# Patient Record
Sex: Female | Born: 1960 | Race: Black or African American | Hispanic: No | Marital: Single | Smoking: Never smoker
Health system: Southern US, Community
[De-identification: ages and names within clinical notes are randomized; demographics above are authoritative.]

## PROBLEM LIST (undated history)

## (undated) DIAGNOSIS — I10 Essential (primary) hypertension: Secondary | ICD-10-CM

## (undated) HISTORY — PX: BREAST SURGERY: SHX581

---

## 1991-10-05 HISTORY — PX: REDUCTION MAMMAPLASTY: SUR839

## 2009-06-28 ENCOUNTER — Emergency Department (HOSPITAL_BASED_OUTPATIENT_CLINIC_OR_DEPARTMENT_OTHER): Admission: EM | Admit: 2009-06-28 | Discharge: 2009-06-28 | Payer: Self-pay | Admitting: Emergency Medicine

## 2010-02-04 ENCOUNTER — Ambulatory Visit: Payer: Self-pay | Admitting: Interventional Radiology

## 2010-02-04 ENCOUNTER — Emergency Department (HOSPITAL_BASED_OUTPATIENT_CLINIC_OR_DEPARTMENT_OTHER): Admission: EM | Admit: 2010-02-04 | Discharge: 2010-02-04 | Payer: Self-pay | Admitting: Emergency Medicine

## 2010-03-05 ENCOUNTER — Ambulatory Visit (HOSPITAL_BASED_OUTPATIENT_CLINIC_OR_DEPARTMENT_OTHER): Admission: RE | Admit: 2010-03-05 | Discharge: 2010-03-05 | Payer: Self-pay | Admitting: Internal Medicine

## 2010-03-05 ENCOUNTER — Ambulatory Visit: Payer: Self-pay | Admitting: Diagnostic Radiology

## 2010-12-06 ENCOUNTER — Emergency Department (INDEPENDENT_AMBULATORY_CARE_PROVIDER_SITE_OTHER): Payer: 59

## 2010-12-06 ENCOUNTER — Emergency Department (HOSPITAL_BASED_OUTPATIENT_CLINIC_OR_DEPARTMENT_OTHER)
Admission: EM | Admit: 2010-12-06 | Discharge: 2010-12-06 | Disposition: A | Discharge status: Home / Self Care | Payer: 59 | Attending: Emergency Medicine | Admitting: Emergency Medicine

## 2010-12-06 DIAGNOSIS — I10 Essential (primary) hypertension: Secondary | ICD-10-CM | POA: Insufficient documentation

## 2010-12-06 DIAGNOSIS — R1031 Right lower quadrant pain: Secondary | ICD-10-CM

## 2010-12-06 DIAGNOSIS — N390 Urinary tract infection, site not specified: Secondary | ICD-10-CM | POA: Insufficient documentation

## 2010-12-06 DIAGNOSIS — R109 Unspecified abdominal pain: Secondary | ICD-10-CM | POA: Insufficient documentation

## 2010-12-06 LAB — URINALYSIS, ROUTINE W REFLEX MICROSCOPIC
Glucose, UA: NEGATIVE mg/dL
Ketones, ur: NEGATIVE mg/dL
Specific Gravity, Urine: 1.027 (ref 1.005–1.030)
pH: 7 (ref 5.0–8.0)

## 2010-12-06 LAB — URINE MICROSCOPIC-ADD ON

## 2011-02-26 ENCOUNTER — Emergency Department (HOSPITAL_BASED_OUTPATIENT_CLINIC_OR_DEPARTMENT_OTHER)
Admission: EM | Admit: 2011-02-26 | Discharge: 2011-02-26 | Disposition: A | Discharge status: Home / Self Care | Payer: 59 | Attending: Emergency Medicine | Admitting: Emergency Medicine

## 2011-02-26 ENCOUNTER — Emergency Department (INDEPENDENT_AMBULATORY_CARE_PROVIDER_SITE_OTHER): Payer: 59

## 2011-02-26 DIAGNOSIS — I10 Essential (primary) hypertension: Secondary | ICD-10-CM | POA: Insufficient documentation

## 2011-02-26 DIAGNOSIS — R1013 Epigastric pain: Secondary | ICD-10-CM | POA: Insufficient documentation

## 2011-02-26 LAB — COMPREHENSIVE METABOLIC PANEL
Albumin: 3.9 g/dL (ref 3.5–5.2)
BUN: 11 mg/dL (ref 6–23)
Chloride: 104 mEq/L (ref 96–112)
Creatinine, Ser: 0.6 mg/dL (ref 0.4–1.2)
GFR calc non Af Amer: >60 mL/min (ref >60)
Glucose, Bld: 83 mg/dL (ref 70–99)
Potassium: 4.3 mEq/L (ref 3.5–5.1)

## 2011-02-26 LAB — URINALYSIS, ROUTINE W REFLEX MICROSCOPIC
Glucose, UA: NEGATIVE mg/dL
Ketones, ur: NEGATIVE mg/dL
Nitrite: NEGATIVE
Specific Gravity, Urine: 1.024 (ref 1.005–1.030)
Urobilinogen, UA: 1 mg/dL (ref 0.0–1.0)
pH: 7 (ref 5.0–8.0)

## 2011-02-26 LAB — CBC
HCT: 37.8 % (ref 36.0–46.0)
MCH: 30.3 pg (ref 26.0–34.0)
MCHC: 34.7 g/dL (ref 30.0–36.0)
Platelets: 231 10*3/uL (ref 150–400)
WBC: 4 10*3/uL (ref 4.0–10.5)

## 2011-02-26 LAB — URINE MICROSCOPIC-ADD ON

## 2011-02-26 LAB — DIFFERENTIAL
Lymphocytes Relative: 45 % (ref 12–46)
Lymphs Abs: 1.8 10*3/uL (ref 0.7–4.0)
Monocytes Absolute: 0.4 10*3/uL (ref 0.1–1.0)
Neutro Abs: 1.7 10*3/uL (ref 1.7–7.7)

## 2011-04-23 ENCOUNTER — Other Ambulatory Visit (HOSPITAL_BASED_OUTPATIENT_CLINIC_OR_DEPARTMENT_OTHER): Payer: Self-pay | Admitting: Obstetrics and Gynecology

## 2011-04-23 DIAGNOSIS — Z1231 Encounter for screening mammogram for malignant neoplasm of breast: Secondary | ICD-10-CM

## 2011-04-29 ENCOUNTER — Ambulatory Visit (HOSPITAL_BASED_OUTPATIENT_CLINIC_OR_DEPARTMENT_OTHER)
Admission: RE | Admit: 2011-04-29 | Discharge: 2011-04-29 | Disposition: A | Discharge status: Home / Self Care | Payer: 59 | Source: Ambulatory Visit | Attending: Obstetrics and Gynecology | Admitting: Obstetrics and Gynecology

## 2011-04-29 DIAGNOSIS — Z1231 Encounter for screening mammogram for malignant neoplasm of breast: Secondary | ICD-10-CM

## 2011-09-09 ENCOUNTER — Emergency Department (HOSPITAL_BASED_OUTPATIENT_CLINIC_OR_DEPARTMENT_OTHER)
Admission: EM | Admit: 2011-09-09 | Discharge: 2011-09-09 | Disposition: A | Discharge status: Home / Self Care | Payer: Self-pay | Attending: Emergency Medicine | Admitting: Emergency Medicine

## 2011-09-09 ENCOUNTER — Encounter: Payer: Self-pay | Admitting: *Deleted

## 2011-09-09 DIAGNOSIS — R05 Cough: Secondary | ICD-10-CM | POA: Insufficient documentation

## 2011-09-09 DIAGNOSIS — B349 Viral infection, unspecified: Secondary | ICD-10-CM

## 2011-09-09 DIAGNOSIS — B9789 Other viral agents as the cause of diseases classified elsewhere: Secondary | ICD-10-CM | POA: Insufficient documentation

## 2011-09-09 DIAGNOSIS — R059 Cough, unspecified: Secondary | ICD-10-CM | POA: Insufficient documentation

## 2011-09-09 DIAGNOSIS — R509 Fever, unspecified: Secondary | ICD-10-CM | POA: Insufficient documentation

## 2011-09-09 MED ORDER — HYDROCODONE-ACETAMINOPHEN 5-500 MG PO TABS: 1.0000 | ORAL_TABLET | Freq: Four times a day (QID) | ORAL | Status: AC | PRN

## 2011-09-09 MED ORDER — KETOROLAC TROMETHAMINE 60 MG/2ML IM SOLN
60.0000 mg | Freq: Once | INTRAMUSCULAR | Status: AC
  Administered 2011-09-09: 60 mg via INTRAMUSCULAR
  Filled 2011-09-09: qty 2

## 2011-09-09 NOTE — ED Provider Notes (Signed)
Medical screening examination/treatment/procedure(s) were performed by non-physician practitioner and as supervising physician I was immediately available for consultation/collaboration.  Nelle Sayed, MD 09/09/11 2246 

## 2011-09-09 NOTE — ED Provider Notes (Signed)
History     CSN: [id number] Arrival date & time: 09/09/2011  2:56 PM   First MD Initiated Contact with Patient 09/09/11 1500      Chief Complaint  Patient presents with  . Fever    (Consider location/radiation/quality/duration/timing/severity/associated sxs/prior treatment) Patient is a 50 y.o. female presenting with fever. The history is provided by the patient. No language interpreter was used.  Fever Primary symptoms of the febrile illness include fever, cough and myalgias. Primary symptoms do not include nausea, vomiting or diarrhea. The current episode started today. This is a new problem. The problem has not changed since onset.   History reviewed. No pertinent past medical history.  Past Surgical History  Procedure Date  . Breast surgery   . Cesarean section     No family history on file.  History  Substance Use Topics  . Smoking status: Not on file  . Smokeless tobacco: Not on file  . Alcohol Use:     OB History    Grav Para Term Preterm Abortions TAB SAB Ect Mult Living                  Review of Systems  Constitutional: Positive for fever.  Respiratory: Positive for cough.   Gastrointestinal: Negative for nausea, vomiting and diarrhea.  Musculoskeletal: Positive for myalgias.  All other systems reviewed and are negative.    Allergies  Review of patient's allergies indicates no known allergies.  Home Medications   Current Outpatient Rx  Name Route Sig Dispense Refill  . IBUPROFEN 200 MG PO TABS Oral Take 400 mg by mouth every 6 (six) hours as needed. For pain and fever     . PHENYLEPH-CPM-DM-ASPIRIN 7.05-05-09-325 MG PO TBEF Oral Take 2 tablets by mouth once as needed. For cold symptoms       BP 141/80  Pulse 113  Temp(Src) 100.9 F (38.3 C) (Oral)  Resp 22  SpO2 98%  Physical Exam  Nursing note and vitals reviewed. Constitutional: She is oriented to person, place, and time. She appears well-developed and well-nourished.  HENT:  Head:  Normocephalic.  Right Ear: External ear normal.  Left Ear: External ear normal.  Nose: Rhinorrhea present.  Mouth/Throat: Posterior oropharyngeal erythema present.  Cardiovascular: Normal rate and regular rhythm.   Pulmonary/Chest: Effort normal and breath sounds normal.  Abdominal: Soft. Bowel sounds are normal.  Musculoskeletal: Normal range of motion.  Neurological: She is alert and oriented to person, place, and time.  Skin: Skin is warm and dry.  Psychiatric: She has a normal mood and affect.    ED Course  Procedures (including critical care time)  Labs Reviewed - No data to display No results found.   1. Viral infection       MDM  Likely ili:no imagining needed at this time:pt feeling better after the toradol:will treat symptomatically at home        Teressa Lower, NP 09/09/11 1634

## 2011-09-09 NOTE — ED Notes (Signed)
Fever, cough, aching all over, headache, and sore throat x 2 days.

## 2012-12-11 ENCOUNTER — Emergency Department (HOSPITAL_BASED_OUTPATIENT_CLINIC_OR_DEPARTMENT_OTHER)
Admission: EM | Admit: 2012-12-11 | Discharge: 2012-12-11 | Disposition: A | Discharge status: Home / Self Care | Payer: 59 | Attending: Emergency Medicine | Admitting: Emergency Medicine

## 2012-12-11 ENCOUNTER — Encounter (HOSPITAL_BASED_OUTPATIENT_CLINIC_OR_DEPARTMENT_OTHER): Payer: Self-pay

## 2012-12-11 DIAGNOSIS — S40019A Contusion of unspecified shoulder, initial encounter: Secondary | ICD-10-CM | POA: Insufficient documentation

## 2012-12-11 DIAGNOSIS — Y92410 Unspecified street and highway as the place of occurrence of the external cause: Secondary | ICD-10-CM | POA: Insufficient documentation

## 2012-12-11 DIAGNOSIS — Y9389 Activity, other specified: Secondary | ICD-10-CM | POA: Insufficient documentation

## 2012-12-11 DIAGNOSIS — S5010XA Contusion of unspecified forearm, initial encounter: Secondary | ICD-10-CM | POA: Insufficient documentation

## 2012-12-11 DIAGNOSIS — S7010XA Contusion of unspecified thigh, initial encounter: Secondary | ICD-10-CM | POA: Insufficient documentation

## 2012-12-11 MED ORDER — NAPROXEN 375 MG PO TABS: 375.0000 mg | ORAL_TABLET | Freq: Two times a day (BID) | ORAL | Status: DC

## 2012-12-11 MED ORDER — CYCLOBENZAPRINE HCL 10 MG PO TABS: 10.0000 mg | ORAL_TABLET | Freq: Two times a day (BID) | ORAL | Status: DC | PRN

## 2012-12-11 MED ORDER — TRAMADOL HCL 50 MG PO TABS: 50.0000 mg | ORAL_TABLET | Freq: Four times a day (QID) | ORAL | Status: DC | PRN
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
## 2012-12-13 NOTE — ED Provider Notes (Signed)
Medical screening examination/treatment/procedure(s) were performed by non-physician practitioner and as supervising physician I was immediately available for consultation/collaboration.  Hurman Horn, MD 12/13/12 2217

## 2013-08-19 ENCOUNTER — Encounter (HOSPITAL_BASED_OUTPATIENT_CLINIC_OR_DEPARTMENT_OTHER): Payer: Self-pay | Admitting: Emergency Medicine

## 2013-08-19 ENCOUNTER — Emergency Department (HOSPITAL_BASED_OUTPATIENT_CLINIC_OR_DEPARTMENT_OTHER)
Admission: EM | Admit: 2013-08-19 | Discharge: 2013-08-19 | Disposition: A | Discharge status: Home / Self Care | Payer: No Typology Code available for payment source | Attending: Emergency Medicine | Admitting: Emergency Medicine

## 2013-08-19 DIAGNOSIS — R110 Nausea: Secondary | ICD-10-CM | POA: Insufficient documentation

## 2013-08-19 DIAGNOSIS — N12 Tubulo-interstitial nephritis, not specified as acute or chronic: Secondary | ICD-10-CM | POA: Diagnosis present

## 2013-08-19 LAB — URINALYSIS, ROUTINE W REFLEX MICROSCOPIC
Bilirubin Urine: NEGATIVE
Glucose, UA: NEGATIVE mg/dL
Ketones, ur: NEGATIVE mg/dL
Nitrite: POSITIVE — AB
Protein, ur: NEGATIVE mg/dL
Specific Gravity, Urine: 1.011 (ref 1.005–1.030)
pH: 6 (ref 5.0–8.0)

## 2013-08-19 LAB — URINE MICROSCOPIC-ADD ON

## 2013-08-19 MED ORDER — CEPHALEXIN 500 MG PO CAPS: 500.0000 mg | ORAL_CAPSULE | Freq: Four times a day (QID) | ORAL | Status: DC

## 2013-08-19 MED ORDER — DEXTROSE 5 % IV SOLN
1.0000 g | Freq: Once | INTRAVENOUS | Status: AC
  Administered 2013-08-19: 1 g via INTRAVENOUS

## 2013-08-19 MED ORDER — CEFTRIAXONE SODIUM 1 G IJ SOLR
INTRAMUSCULAR | Status: AC
  Filled 2013-08-19: qty 10

## 2013-08-19 MED ORDER — ONDANSETRON HCL 4 MG/2ML IJ SOLN
4.0000 mg | Freq: Once | INTRAMUSCULAR | Status: AC
  Administered 2013-08-19: 4 mg via INTRAVENOUS
  Filled 2013-08-19: qty 2

## 2013-08-19 MED ORDER — SODIUM CHLORIDE 0.9 % IV BOLUS (SEPSIS)
1000.0000 mL | INTRAVENOUS | Status: AC
  Administered 2013-08-19: 1000 mL via INTRAVENOUS

## 2013-08-19 MED ORDER — OXYCODONE-ACETAMINOPHEN 5-325 MG PO TABS: 1.0000 | ORAL_TABLET | Freq: Four times a day (QID) | ORAL | Status: DC | PRN

## 2013-08-19 NOTE — ED Provider Notes (Signed)
CSN: [id number]     Arrival date & time 08/19/13  1210 History   First MD Initiated Contact with Patient 08/19/13 1240     Chief Complaint  Patient presents with  . Dysuria   (Consider location/radiation/quality/duration/timing/severity/associated sxs/prior Treatment) Patient is a 52 y.o. female presenting with dysuria. The history is provided by the patient.  Dysuria Pain quality:  Burning Pain severity:  Moderate Onset quality:  Gradual Duration:  5 days Timing:  Constant Progression:  Worsening Chronicity:  New Recent urinary tract infections: no   Relieved by:  Nothing Worsened by:  Nothing tried Ineffective treatments: azo. Associated symptoms: nausea   Associated symptoms: no abdominal pain, no fever and no vomiting     History reviewed. No pertinent past medical history. Past Surgical History  Procedure Laterality Date  . Breast surgery    . Cesarean section     No family history on file. History  Substance Use Topics  . Smoking status: Never Smoker   . Smokeless tobacco: Not on file  . Alcohol Use: No   OB History   Grav Para Term Preterm Abortions TAB SAB Ect Mult Living                 Review of Systems  Constitutional: Negative for fever and fatigue.  HENT: Negative for congestion and drooling.   Eyes: Negative for pain.  Respiratory: Negative for cough and shortness of breath.   Cardiovascular: Negative for chest pain.  Gastrointestinal: Positive for nausea. Negative for vomiting, abdominal pain and diarrhea.  Genitourinary: Positive for dysuria. Negative for hematuria.  Musculoskeletal: Negative for back pain, gait problem and neck pain.  Skin: Negative for color change.  Neurological: Negative for dizziness and headaches.  Hematological: Negative for adenopathy.  Psychiatric/Behavioral: Negative for behavioral problems.  All other systems reviewed and are negative.    Allergies  Review of patient's allergies indicates no known  allergies.  Home Medications  No current outpatient prescriptions on file. BP 108/45  Pulse 113  Temp(Src) 99.9 F (37.7 C) (Oral)  Resp 16  SpO2 100% Physical Exam  Nursing note and vitals reviewed. Constitutional: She is oriented to person, place, and time. She appears well-developed and well-nourished.  HENT:  Head: Normocephalic.  Mouth/Throat: Oropharynx is clear and moist. No oropharyngeal exudate.  Eyes: Conjunctivae and EOM are normal. Pupils are equal, round, and reactive to light.  Neck: Normal range of motion. Neck supple.  Cardiovascular: Regular rhythm, normal heart sounds and intact distal pulses.  Exam reveals no gallop and no friction rub.   No murmur heard. HR 113  Pulmonary/Chest: Effort normal and breath sounds normal. No respiratory distress. She has no wheezes.  Abdominal: Soft. Bowel sounds are normal. There is no tenderness. There is no rebound and no guarding.  Musculoskeletal: Normal range of motion. She exhibits no edema and no tenderness.  Mild bilateral lower lumbar paraspinal tenderness to palpation.  Neurological: She is alert and oriented to person, place, and time.  Skin: Skin is warm and dry.  Psychiatric: She has a normal mood and affect. Her behavior is normal.    ED Course  Procedures (including critical care time) Labs Review Labs Reviewed  URINALYSIS, ROUTINE W REFLEX MICROSCOPIC - Abnormal; Notable for the following:    APPearance CLOUDY (*)    Hgb urine dipstick SMALL (*)    Nitrite POSITIVE (*)    Leukocytes, UA MODERATE (*)    All other components within normal limits  URINE MICROSCOPIC-ADD ON - Abnormal;  Notable for the following:    Squamous Epithelial / LPF FEW (*)    Bacteria, UA FEW (*)    All other components within normal limits  URINE CULTURE   Imaging Review No results found.  EKG Interpretation   None       MDM   1. Pyelonephritis    1:10 PM 52 y.o. female who presents with dysuria for 4-5 days. She also  notes the development of mild low back pain in the last few days. She has nausea but denies any vomiting or fevers. She is mildly tachycardic here. Her urine shows evidence of a urinary tract infection. Will treat her with IV fluids and IV Rocephin.  3:02 PM: Pt feeling much better, no vomiting here. Will send home on po keflex.  I have discussed the diagnosis/risks/treatment options with the patient and believe the pt to be eligible for discharge home to follow-up with pcp as needed. We also discussed returning to the ED immediately if new or worsening sx occur. We discussed the sx which are most concerning (e.g., inability to tolerate abx by mouth, worsening pain) that necessitate immediate return. Any new prescriptions provided to the patient are listed below.  Discharge Medication List as of 08/19/2013  3:03 PM    START taking these medications   Details  cephALEXin (KEFLEX) 500 MG capsule Take 1 capsule (500 mg total) by mouth 4 (four) times daily., Starting 08/19/2013, Until Discontinued, Print    oxyCODONE-acetaminophen (PERCOCET) 5-325 MG per tablet Take 1 tablet by mouth every 6 (six) hours as needed for moderate pain., Starting 08/19/2013, Until Discontinued, Print         Junius Argyle, MD 08/19/13 [phone]

## 2013-08-19 NOTE — ED Notes (Signed)
Patient here with dysuria, hematuria since Tuesday. Using AZO with no relief. Also complains of lower backpain

## 2013-08-21 LAB — URINE CULTURE

## 2014-05-13 ENCOUNTER — Emergency Department (HOSPITAL_BASED_OUTPATIENT_CLINIC_OR_DEPARTMENT_OTHER)
Admission: EM | Admit: 2014-05-13 | Discharge: 2014-05-13 | Discharge status: Against Medical Advice | Payer: No Typology Code available for payment source | Attending: Emergency Medicine | Admitting: Emergency Medicine

## 2014-05-13 ENCOUNTER — Encounter (HOSPITAL_BASED_OUTPATIENT_CLINIC_OR_DEPARTMENT_OTHER): Payer: Self-pay | Admitting: Emergency Medicine

## 2014-05-13 DIAGNOSIS — R51 Headache: Secondary | ICD-10-CM | POA: Insufficient documentation

## 2014-05-13 HISTORY — DX: Essential (primary) hypertension: I10

## 2014-05-13 NOTE — ED Notes (Signed)
Patient called x2 for room assignemnt, no answer.  Walked to bistro/computer area no answer.

## 2014-05-13 NOTE — ED Notes (Addendum)
Patient called x1 for room assignment, no answer.

## 2014-05-13 NOTE — ED Notes (Signed)
Pt c/o h/a with sinus pressure and facial pain x 2 days

## 2015-10-16 ENCOUNTER — Encounter (HOSPITAL_BASED_OUTPATIENT_CLINIC_OR_DEPARTMENT_OTHER): Payer: Self-pay | Admitting: *Deleted

## 2015-10-16 ENCOUNTER — Emergency Department (HOSPITAL_BASED_OUTPATIENT_CLINIC_OR_DEPARTMENT_OTHER)
Admission: EM | Admit: 2015-10-16 | Discharge: 2015-10-16 | Disposition: A | Discharge status: Home / Self Care | Payer: Self-pay | Attending: Emergency Medicine | Admitting: Emergency Medicine

## 2015-10-16 DIAGNOSIS — N390 Urinary tract infection, site not specified: Secondary | ICD-10-CM

## 2015-10-16 DIAGNOSIS — R319 Hematuria, unspecified: Secondary | ICD-10-CM

## 2015-10-16 DIAGNOSIS — Z792 Long term (current) use of antibiotics: Secondary | ICD-10-CM | POA: Insufficient documentation

## 2015-10-16 DIAGNOSIS — R809 Proteinuria, unspecified: Secondary | ICD-10-CM | POA: Insufficient documentation

## 2015-10-16 DIAGNOSIS — I10 Essential (primary) hypertension: Secondary | ICD-10-CM | POA: Insufficient documentation

## 2015-10-16 DIAGNOSIS — N3001 Acute cystitis with hematuria: Secondary | ICD-10-CM | POA: Insufficient documentation

## 2015-10-16 LAB — URINALYSIS, ROUTINE W REFLEX MICROSCOPIC
BILIRUBIN URINE: NEGATIVE
GLUCOSE, UA: NEGATIVE mg/dL
Ketones, ur: NEGATIVE mg/dL
Nitrite: NEGATIVE
PH: 7.5 (ref 5.0–8.0)
Protein, ur: 100 mg/dL — AB
SPECIFIC GRAVITY, URINE: 1.019 (ref 1.005–1.030)

## 2015-10-16 LAB — URINE MICROSCOPIC-ADD ON

## 2015-10-16 MED ORDER — AMLODIPINE BESYLATE 5 MG PO TABS: 5.0000 mg | ORAL_TABLET | Freq: Every day | ORAL | Status: AC

## 2015-10-16 MED ORDER — CEPHALEXIN 500 MG PO CAPS: 500.0000 mg | ORAL_CAPSULE | Freq: Four times a day (QID) | ORAL | Status: DC

## 2015-10-16 MED FILL — AMLODIPINE BESYLATE 5 MG TA: 5 | 30 days supply | Qty: 30 | Fill #0

## 2015-10-16 MED FILL — CEPHALEXIN 500 MG CAPSULE: 500 | 7 days supply | Qty: 28 | Fill #0

## 2015-10-16 NOTE — ED Notes (Signed)
Dysuria x 4 days. Hx of same. Lower back and lower abdominal pain.

## 2015-10-16 NOTE — ED Provider Notes (Signed)
CSN: [id number]     Arrival date & time 10/16/15  1207 History   First MD Initiated Contact with Patient 10/16/15 1216     Chief Complaint  Patient presents with  . Dysuria    HPI  Kayla Pittman is an 55 y.o. female with history of HTN who presents to the ED for evaluation of dysuria, urinary frequency/urgency, and lower abdominal and back pain. She states her symptoms started four days ago. She states her urine is also dark and cloudy. She states she thinks she has a UTI and has had UTIs many times in the past. She denies fever or chills. Denies N/V/D. She states her abdominal and back pain is a dull ache. She has not tried anything for her symptoms. She does not remember what antibiotics have worked well for her in the past.   Past Medical History  Diagnosis Date  . Hypertension    Past Surgical History  Procedure Laterality Date  . Breast surgery    . Cesarean section     No family history on file. Social History  Substance Use Topics  . Smoking status: Never Smoker   . Smokeless tobacco: None  . Alcohol Use: No   OB History    No data available     Review of Systems  All other systems reviewed and are negative.     Allergies  Review of patient's allergies indicates no known allergies.  Home Medications   Prior to Admission medications   Medication Sig Start Date End Date Taking? Authorizing Provider  CLONIDINE HCL PO Take by mouth.   Yes Historical Provider, MD  cephALEXin (KEFLEX) 500 MG capsule Take 1 capsule (500 mg total) by mouth 4 (four) times daily. 08/19/13   Purvis SheffieldForrest Harrison, MD  oxyCODONE-acetaminophen (PERCOCET) 5-325 MG per tablet Take 1 tablet by mouth every 6 (six) hours as needed for moderate pain. 08/19/13   Purvis SheffieldForrest Harrison, MD   BP 152/101 mmHg  Pulse 80  Temp(Src) 98.6 F (37 C) (Oral)  Resp 18  Ht 5\' 2"  (1.575 m)  Wt 104.327 kg  BMI 42.06 kg/m2  SpO2 99% Physical Exam  Constitutional: She is oriented to person, place, and time. No  distress.  HENT:  Right Ear: External ear normal.  Left Ear: External ear normal.  Nose: Nose normal.  Mouth/Throat: Oropharynx is clear and moist. No oropharyngeal exudate.  Eyes: Conjunctivae and EOM are normal. Pupils are equal, round, and reactive to light.  Neck: Normal range of motion. Neck supple.  Cardiovascular: Normal rate, regular rhythm, normal heart sounds and intact distal pulses.   Pulmonary/Chest: Effort normal and breath sounds normal. No respiratory distress. She exhibits no tenderness.  Abdominal: Soft. Bowel sounds are normal. She exhibits no distension. There is tenderness in the suprapubic area. There is no rebound and no guarding.  No CVA tenderness  Musculoskeletal: She exhibits no edema.  Neurological: She is alert and oriented to person, place, and time. No cranial nerve deficit.  Skin: Skin is warm and dry. She is not diaphoretic.  Psychiatric: She has a normal mood and affect.  Nursing note and vitals reviewed.   ED Course  Procedures (including critical care time) Labs Review Labs Reviewed  URINALYSIS, ROUTINE W REFLEX MICROSCOPIC (NOT AT [facility]) - Abnormal; Notable for the following:    Color, Urine AMBER (*)    APPearance TURBID (*)    Hgb urine dipstick LARGE (*)    Protein, ur 100 (*)    Leukocytes, UA LARGE (*)  All other components within normal limits  URINE MICROSCOPIC-ADD ON - Abnormal; Notable for the following:    Squamous Epithelial / LPF 0-5 (*)    Bacteria, UA MANY (*)    All other components within normal limits  URINE CULTURE    Imaging Review No results found. I have personally reviewed and evaluated these images and lab results as part of my medical decision-making.   EKG Interpretation None      MDM   Final diagnoses:  Urinary tract infection with hematuria, site unspecified  Proteinuria  Essential hypertension    UA shows e/o UTI. Pt has been treated with Keflex in the past and tolerated Keflex well. Past cultures  show susceptibility. Will give rx for keflex and send urine for culture. Pt also with new proteinuria. She does have chronic history of HTN and is hypertensive today. Pt has been prescribed clonidine in the past but is no longer taking it. Given ongoing HTN and proteinuria I discussed w/ pt that I will give her rx for Norvasc and to f/u with PCP (resource guide given) for ongoing BP management. She will need re-check of kidney function too. She has no headache, visual disturbances, or other symptoms concerning for hypertensive emergency. Pt verbalized understanding. Otherwise pt is afebrile, nontoxic, and VSS. No indication for further workup at this time. ER return precautions given.     Carlene Coria, PA-C 10/16/15 1352  Kayla Porter, MD 10/23/15 [phone]

## 2015-10-16 NOTE — Discharge Instructions (Signed)
You were seen in the emergency room today for evaluation of UTI symptoms. Your urine test does show evidence of infection. I will give you a prescription for Keflex, an antibiotic, to take for one week. Please take the entire course as prescribed. Please drink plenty of fluids to stay hydrated. Your urine test today also showed protein in your urine which can be an early sign of kidney damage due to high blood pressure. Your blood pressure was high today and it appears it has also been high in the past. Please call one of the clinics in the resource guide provided to establish primary care. You will need to take medication for your blood pressure. Your primary care provider will also need to recheck your kidney function test. In the meantime I will give you a prescription for Norvasc, a blood pressure medicine.   Return to the ER for new or worsening symptoms.

## 2015-10-18 LAB — URINE CULTURE

## 2015-10-20 ENCOUNTER — Telehealth: Payer: Self-pay | Admitting: *Deleted

## 2015-10-20 NOTE — ED Notes (Signed)
(+)  urine culture treated with Cephalexin, no further treatment needed per Corey Ball, Pharm 

## 2015-11-26 ENCOUNTER — Telehealth: Payer: Self-pay | Admitting: Behavioral Health

## 2015-11-26 NOTE — Telephone Encounter (Signed)
Unable to reach patient at time of Pre-Visit Call.  Left message for patient to return call when available.    

## 2015-11-27 ENCOUNTER — Ambulatory Visit: Payer: Self-pay | Admitting: Family Medicine

## 2018-01-22 ENCOUNTER — Emergency Department (HOSPITAL_BASED_OUTPATIENT_CLINIC_OR_DEPARTMENT_OTHER)
Admission: EM | Admit: 2018-01-22 | Discharge: 2018-01-22 | Disposition: A | Discharge status: Home / Self Care | Payer: 59 | Attending: Emergency Medicine | Admitting: Emergency Medicine

## 2018-01-22 ENCOUNTER — Emergency Department (HOSPITAL_BASED_OUTPATIENT_CLINIC_OR_DEPARTMENT_OTHER): Payer: 59

## 2018-01-22 ENCOUNTER — Other Ambulatory Visit: Payer: Self-pay

## 2018-01-22 ENCOUNTER — Encounter (HOSPITAL_BASED_OUTPATIENT_CLINIC_OR_DEPARTMENT_OTHER): Payer: Self-pay | Admitting: Emergency Medicine

## 2018-01-22 DIAGNOSIS — M545 Low back pain: Secondary | ICD-10-CM | POA: Diagnosis present

## 2018-01-22 DIAGNOSIS — G5702 Lesion of sciatic nerve, left lower limb: Secondary | ICD-10-CM | POA: Insufficient documentation

## 2018-01-22 DIAGNOSIS — M25552 Pain in left hip: Secondary | ICD-10-CM

## 2018-01-22 DIAGNOSIS — Z79899 Other long term (current) drug therapy: Secondary | ICD-10-CM | POA: Insufficient documentation

## 2018-01-22 DIAGNOSIS — I10 Essential (primary) hypertension: Secondary | ICD-10-CM | POA: Insufficient documentation

## 2018-01-22 MED ORDER — KETOROLAC TROMETHAMINE 15 MG/ML IJ SOLN
15.0000 mg | Freq: Once | INTRAMUSCULAR | Status: AC
  Administered 2018-01-22: 15 mg via INTRAMUSCULAR

## 2018-01-22 MED ORDER — ACETAMINOPHEN 500 MG PO TABS
1000.0000 mg | ORAL_TABLET | Freq: Once | ORAL | Status: AC
  Administered 2018-01-22: 1000 mg via ORAL
  Filled 2018-01-22: qty 2

## 2018-01-22 MED ORDER — OXYCODONE HCL 5 MG PO TABS
5.0000 mg | ORAL_TABLET | Freq: Once | ORAL | Status: AC
  Administered 2018-01-22: 5 mg via ORAL
  Filled 2018-01-22: qty 1

## 2018-01-22 MED ORDER — KETOROLAC TROMETHAMINE 15 MG/ML IJ SOLN
15.0000 mg | Freq: Once | INTRAMUSCULAR | Status: DC
  Filled 2018-01-22: qty 1

## 2018-01-22 MED ORDER — DIAZEPAM 5 MG PO TABS
5.0000 mg | ORAL_TABLET | Freq: Once | ORAL | Status: AC
  Administered 2018-01-22: 5 mg via ORAL
  Filled 2018-01-22: qty 1

## 2018-01-22 NOTE — ED Provider Notes (Signed)
[facility] EMERGENCY DEPARTMENT Provider Note   CSN: [id number] Arrival date & time: 01/22/18  1516     History   Chief Complaint Chief Complaint  Patient presents with  . Hip Pain    HPI Kayla Pittman is a 57 y.o. female.  57 yo F with a chief complaint of left lower back pain.  This is been going on for the past 3 or 4 days.  She states she stepped wrong about 3 or 4 days ago.  Denies loss of bowel or bladder denies loss of perirectal sensation denies numbness or weakness to the leg.  She denies fevers or chills denies instrumentation of the back.  She denies any direct trauma.  The history is provided by the patient.  Hip Pain  This is a new problem. The current episode started yesterday. The problem occurs constantly. The problem has been gradually worsening. Pertinent negatives include no chest pain, no headaches and no shortness of breath. Nothing aggravates the symptoms. Nothing relieves the symptoms. She has tried nothing for the symptoms. The treatment provided no relief.    Past Medical History:  Diagnosis Date  . Hypertension     Patient Active Problem List   Diagnosis Date Noted  . Pyelonephritis 08/19/2013    Past Surgical History:  Procedure Laterality Date  . BREAST SURGERY    . CESAREAN SECTION       OB History   None      Home Medications    Prior to Admission medications   Medication Sig Start Date End Date Taking? Authorizing Provider  amLODipine (NORVASC) 5 MG tablet Take 1 tablet (5 mg total) by mouth daily. 10/16/15   Sam, Ace GinsSerena Y, PA-C  cephALEXin (KEFLEX) 500 MG capsule Take 1 capsule (500 mg total) by mouth 4 (four) times daily. 08/19/13   Purvis SheffieldHarrison, Forrest, MD  cephALEXin (KEFLEX) 500 MG capsule Take 1 capsule (500 mg total) by mouth 4 (four) times daily. 10/16/15   Sam, Ace GinsSerena Y, PA-C  oxyCODONE-acetaminophen (PERCOCET) 5-325 MG per tablet Take 1 tablet by mouth every 6 (six) hours as needed for moderate pain. 08/19/13    Purvis SheffieldHarrison, Forrest, MD  CLONIDINE HCL PO Take by mouth.  10/16/15  [provider]    Family History History reviewed. No pertinent family history.  Social History Social History   Tobacco Use  . Smoking status: Never Smoker  . Smokeless tobacco: Never Used  Substance Use Topics  . Alcohol use: No  . Drug use: No     Allergies   Patient has no known allergies.   Review of Systems Review of Systems  Constitutional: Negative for chills and fever.  HENT: Negative for congestion and rhinorrhea.   Eyes: Negative for redness and visual disturbance.  Respiratory: Negative for shortness of breath and wheezing.   Cardiovascular: Negative for chest pain and palpitations.  Gastrointestinal: Negative for nausea and vomiting.  Genitourinary: Negative for dysuria and urgency.  Musculoskeletal: Positive for arthralgias and myalgias.  Skin: Negative for pallor and wound.  Neurological: Negative for dizziness and headaches.     Physical Exam Updated Vital Signs BP (!) 139/96 (BP Location: Left Arm)   Pulse 93   Temp 98.2 F (36.8 C) (Oral)   Resp 18   Ht 5' (1.524 m)   Wt 89.4 kg (197 lb)   SpO2 100%   BMI 38.47 kg/m   Physical Exam  Constitutional: She is oriented to person, place, and time. She appears well-developed and well-nourished.  No distress.  HENT:  Head: Normocephalic and atraumatic.  Eyes: Pupils are equal, round, and reactive to light. EOM are normal.  Neck: Normal range of motion. Neck supple.  Cardiovascular: Normal rate and regular rhythm. Exam reveals no gallop and no friction rub.  No murmur heard. Pulmonary/Chest: Effort normal. She has no wheezes. She has no rales.  Abdominal: Soft. She exhibits no distension and no mass. There is no tenderness. There is no guarding.  Musculoskeletal: She exhibits no edema or tenderness.  Neurological: She is alert and oriented to person, place, and time.  Skin: Skin is warm and dry. She is not diaphoretic.    Psychiatric: She has a normal mood and affect. Her behavior is normal.  Nursing note and vitals reviewed.    ED Treatments / Results  Labs (all labs ordered are listed, but only abnormal results are displayed) Labs Reviewed - No data to display  EKG None  Radiology Dg Hip Unilat With Pelvis 2-3 Views Left  Result Date: 01/22/2018 CLINICAL DATA:  Left hip pain for 4 days.  No known injury. EXAM: DG HIP (WITH OR WITHOUT PELVIS) 2-3V LEFT COMPARISON:  None. FINDINGS: No acute bony or joint abnormality is identified. Joint spaces are preserved. Partial visualization of lower lumbar degenerative change. Small calcified uterine fibroid is noted. IMPRESSION: Negative left hip. Lower lumbar spondylosis. Electronically Signed   By: Drusilla Kanner M.D.   On: 01/22/2018 16:09    Procedures Procedures (including critical care time)  Medications Ordered in ED Medications  ketorolac (TORADOL) 15 MG/ML injection 15 mg (has no administration in time range)  acetaminophen (TYLENOL) tablet 1,000 mg (has no administration in time range)  oxyCODONE (Oxy IR/ROXICODONE) immediate release tablet 5 mg (has no administration in time range)  diazepam (VALIUM) tablet 5 mg (has no administration in time range)     Initial Impression / Assessment and Plan / ED Course  I have reviewed the triage vital signs and the nursing notes.  Pertinent labs & imaging results that were available during my care of the patient were reviewed by me and considered in my medical decision making (see chart for details).     57 yo F with a chief complaint of left-sided low back pain.  This is localized to the muscle belly of the piriformis.  I will give her stretches to do at home.  We will treat her pain here.  Tylenol and NSAIDs at home.  PCP follow-up.   4:21 PM:  I have discussed the diagnosis/risks/treatment options with the patient and believe the pt to be eligible for discharge home to follow-up with PCP. We also  discussed returning to the ED immediately if new or worsening sx occur. We discussed the sx which are most concerning (e.g., sudden worsening pain, fever, inability to tolerate by mouth) that necessitate immediate return. Medications administered to the patient during their visit and any new prescriptions provided to the patient are listed below.  Medications given during this visit Medications  ketorolac (TORADOL) 15 MG/ML injection 15 mg (has no administration in time range)  acetaminophen (TYLENOL) tablet 1,000 mg (has no administration in time range)  oxyCODONE (Oxy IR/ROXICODONE) immediate release tablet 5 mg (has no administration in time range)  diazepam (VALIUM) tablet 5 mg (has no administration in time range)    Labs reviewed  Images reviewed L hip xray without fx DDX sciatica, piriformis syndrome   The patient appears reasonably screen and/or stabilized for discharge and I doubt any other medical  condition or other [facility] requiring further screening, evaluation, or treatment in the ED at this time prior to discharge.     Final Clinical Impressions(s) / ED Diagnoses   Final diagnoses:  Left hip pain  Piriformis syndrome of left side    ED Discharge Orders    None       Melene Plan, DO 01/22/18 1621

## 2018-01-22 NOTE — Discharge Instructions (Signed)
Take 4 over the counter ibuprofen tablets 3 times a day or 2 over-the-counter naproxen tablets twice a day for pain. Also take tylenol 1000mg(2 extra strength) four times a day.    

## 2018-01-22 NOTE — ED Triage Notes (Signed)
Patient states that she has had pain to her left hip since Wed. She woke up and it was bothering her

## 2018-05-19 ENCOUNTER — Emergency Department (HOSPITAL_BASED_OUTPATIENT_CLINIC_OR_DEPARTMENT_OTHER)
Admission: EM | Admit: 2018-05-19 | Discharge: 2018-05-19 | Disposition: A | Discharge status: Home / Self Care | Payer: 59 | Attending: Emergency Medicine | Admitting: Emergency Medicine

## 2018-05-19 ENCOUNTER — Emergency Department (HOSPITAL_BASED_OUTPATIENT_CLINIC_OR_DEPARTMENT_OTHER): Payer: 59

## 2018-05-19 ENCOUNTER — Encounter (HOSPITAL_BASED_OUTPATIENT_CLINIC_OR_DEPARTMENT_OTHER): Payer: Self-pay

## 2018-05-19 ENCOUNTER — Other Ambulatory Visit: Payer: Self-pay

## 2018-05-19 DIAGNOSIS — W208XXA Other cause of strike by thrown, projected or falling object, initial encounter: Secondary | ICD-10-CM | POA: Diagnosis not present

## 2018-05-19 DIAGNOSIS — Z79899 Other long term (current) drug therapy: Secondary | ICD-10-CM | POA: Insufficient documentation

## 2018-05-19 DIAGNOSIS — S91111A Laceration without foreign body of right great toe without damage to nail, initial encounter: Secondary | ICD-10-CM | POA: Diagnosis not present

## 2018-05-19 DIAGNOSIS — Y929 Unspecified place or not applicable: Secondary | ICD-10-CM | POA: Insufficient documentation

## 2018-05-19 DIAGNOSIS — Y999 Unspecified external cause status: Secondary | ICD-10-CM | POA: Insufficient documentation

## 2018-05-19 DIAGNOSIS — Y939 Activity, unspecified: Secondary | ICD-10-CM | POA: Insufficient documentation

## 2018-05-19 DIAGNOSIS — S99921A Unspecified injury of right foot, initial encounter: Secondary | ICD-10-CM | POA: Diagnosis present

## 2018-05-19 DIAGNOSIS — I10 Essential (primary) hypertension: Secondary | ICD-10-CM | POA: Diagnosis not present

## 2018-05-19 NOTE — ED Triage Notes (Signed)
Pt states she dropped a bin on right great toe approx 8am-NAD-steady gait-NAD-steady gait

## 2018-05-19 NOTE — Discharge Instructions (Addendum)
Thank you for allowing me to care for you today in the Emergency Department.   Take 600 mg of ibuprofen with food or 650 mg of Tylenol once every 6 hours for pain control.  Apply ice for 15 to 20 minutes as often as needed for pain control.  You can also soak the toe and Epson salt if it is painful.  Since there is a small cut around the edge of the nail, keep that area clean with warm water and soap.  You can also apply a small amount of a topical antibiotic such as bacitracin or Neosporin to prevent infection.  Avoid submerging the foot and pools, hot tubs, or the ocean until the cut heals.  If the toe gets red, hot, swollen, if white area develops around the nailbed, or if you develop fever or chills, return to the emergency department for evaluation.

## 2018-05-19 NOTE — ED Provider Notes (Signed)
[facility] EMERGENCY DEPARTMENT Provider Note   CSN: [id number] Arrival date & time: 05/19/18  1144     History   Chief Complaint Chief Complaint  Patient presents with  . Toe Injury    HPI Kevin FentonSharon D Tillery is a 57 y.o. female with a h/o of HTN who presents to the emergency department with a chief complaint of right great toe injury.  The patient reports that she dropped a heavy metal beam on her right great toe around 8 AM while she was at work.  She denies mild pain that is worse with movement of the toe.  She has been able to ambulate since the injury.  She took Tylenol which resolved her pain.  She denies numbness or weakness.  No history of right foot or toe surgeries or injuries.  The history is provided by the patient. No language interpreter was used.    Past Medical History:  Diagnosis Date  . Hypertension     Patient Active Problem List   Diagnosis Date Noted  . Pyelonephritis 08/19/2013    Past Surgical History:  Procedure Laterality Date  . BREAST SURGERY    . CESAREAN SECTION       OB History   None      Home Medications    Prior to Admission medications   Medication Sig Start Date End Date Taking? Authorizing Provider  amLODipine (NORVASC) 5 MG tablet Take 1 tablet (5 mg total) by mouth daily. 10/16/15   Carlene CoriaSam, Serena Y, PA-C    Family History No family history on file.  Social History Social History   Tobacco Use  . Smoking status: Never Smoker  . Smokeless tobacco: Never Used  Substance Use Topics  . Alcohol use: No  . Drug use: No     Allergies   Patient has no known allergies.   Review of Systems Review of Systems  Constitutional: Negative for activity change.  Respiratory: Negative for shortness of breath.   Cardiovascular: Negative for chest pain.  Gastrointestinal: Negative for abdominal pain.  Genitourinary: Negative for dysuria.  Musculoskeletal: Positive for arthralgias and myalgias. Negative for back pain,  gait problem and joint swelling.  Skin: Negative for rash.  Allergic/Immunologic: Negative for immunocompromised state.  Neurological: Negative for headaches.  Psychiatric/Behavioral: Negative for confusion.     Physical Exam Updated Vital Signs BP (!) 150/88 (BP Location: Left Arm)   Pulse 78   Temp 98.5 F (36.9 C) (Oral)   Resp 18   Ht 5' (1.524 m)   Wt 93.4 kg   SpO2 98%   BMI 40.21 kg/m   Physical Exam  Constitutional: No distress.  HENT:  Head: Normocephalic.  Eyes: Conjunctivae are normal.  Neck: Neck supple.  Cardiovascular: Normal rate and regular rhythm. Exam reveals no gallop and no friction rub.  No murmur heard. Pulmonary/Chest: Effort normal. No respiratory distress.  Abdominal: Soft. She exhibits no distension.  Musculoskeletal:  Full active and passive range of motion of the right hallux.  Good capillary refill.  Sensation is intact throughout all 4 distal tips of the digit.  Symmetric tandem gait.  There is a 0.25 mm superficial abrasion to the lateral aspect of the middle of the toenail.  No active bleeding.  No surrounding erythema, edema, or warmth.  Neurological: She is alert.  Skin: Skin is warm. No rash noted.  Psychiatric: Her behavior is normal.  Nursing note and vitals reviewed.    ED Treatments / Results  Labs (all labs  ordered are listed, but only abnormal results are displayed) Labs Reviewed - No data to display  EKG None  Radiology Dg Toe Great Right  Result Date: 05/19/2018 CLINICAL DATA:  Pain post blunt trauma EXAM: RIGHT GREAT TOE COMPARISON:  None. FINDINGS: There is no evidence of fracture or dislocation. There is no evidence of arthropathy or other focal bone abnormality. Soft tissues are unremarkable. IMPRESSION: Negative. Electronically Signed   By: Corlis Leak  Hassell M.D.   On: 05/19/2018 12:17    Procedures Procedures (including critical care time)  Medications Ordered in ED Medications - No data to display   Initial  Impression / Assessment and Plan / ED Course  I have reviewed the triage vital signs and the nursing notes.  Pertinent labs & imaging results that were available during my care of the patient were reviewed by me and considered in my medical decision making (see chart for details).     57 year old female with history of hypertension presenting with right great toe injury that occurred this morning after she dropped a metal beam on the digit.  X-ray of the digit is negative for fracture.  She took Tylenol prior to arrival which resolved her pain.  Her Tdap was updated in 2017.  On exam, there is a small superficial laceration lateral to the nailbed.  Home care instructions, including infection prevention, discussed.  All questions answered.  She is hemodynamically in no acute distress.  She is safe for discharge home with outpatient follow-up at this time.  Final Clinical Impressions(s) / ED Diagnoses   Final diagnoses:  Injury of right great toe, initial encounter    ED Discharge Orders    None       Barkley BoardsMcDonald, Audy Dauphine A, PA-C 05/19/18 1306    Loren RacerYelverton, David, MD 05/19/18 1356

## 2020-07-07 ENCOUNTER — Emergency Department (HOSPITAL_BASED_OUTPATIENT_CLINIC_OR_DEPARTMENT_OTHER)
Admission: EM | Admit: 2020-07-07 | Discharge: 2020-07-07 | Disposition: A | Discharge status: Home / Self Care | Payer: 59 | Attending: Emergency Medicine | Admitting: Emergency Medicine

## 2020-07-07 ENCOUNTER — Other Ambulatory Visit: Payer: Self-pay

## 2020-07-07 ENCOUNTER — Encounter (HOSPITAL_BASED_OUTPATIENT_CLINIC_OR_DEPARTMENT_OTHER): Payer: Self-pay | Admitting: *Deleted

## 2020-07-07 ENCOUNTER — Emergency Department (HOSPITAL_BASED_OUTPATIENT_CLINIC_OR_DEPARTMENT_OTHER): Payer: 59

## 2020-07-07 DIAGNOSIS — I10 Essential (primary) hypertension: Secondary | ICD-10-CM | POA: Diagnosis not present

## 2020-07-07 DIAGNOSIS — Y92410 Unspecified street and highway as the place of occurrence of the external cause: Secondary | ICD-10-CM | POA: Insufficient documentation

## 2020-07-07 DIAGNOSIS — M545 Low back pain, unspecified: Secondary | ICD-10-CM | POA: Insufficient documentation

## 2020-07-07 DIAGNOSIS — M25512 Pain in left shoulder: Secondary | ICD-10-CM | POA: Insufficient documentation

## 2020-07-07 DIAGNOSIS — Z79899 Other long term (current) drug therapy: Secondary | ICD-10-CM | POA: Diagnosis not present

## 2020-07-07 MED ORDER — IBUPROFEN 400 MG PO TABS
600.0000 mg | ORAL_TABLET | Freq: Once | ORAL | Status: AC
  Administered 2020-07-07: 17:00:00 600 mg via ORAL
  Filled 2020-07-07: qty 1

## 2020-07-07 MED ORDER — METHOCARBAMOL 500 MG PO TABS: 500.0000 mg | ORAL_TABLET | Freq: Two times a day (BID) | ORAL | 0 refills | Status: DC
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
## 2020-07-07 NOTE — Discharge Instructions (Signed)

## 2020-07-08 ENCOUNTER — Telehealth (HOSPITAL_BASED_OUTPATIENT_CLINIC_OR_DEPARTMENT_OTHER): Payer: Self-pay | Admitting: Emergency Medicine

## 2020-07-08 MED ORDER — METHOCARBAMOL 500 MG PO TABS: 500.0000 mg | ORAL_TABLET | Freq: Two times a day (BID) | ORAL | 0 refills | Status: AC

## 2020-07-08 NOTE — Telephone Encounter (Signed)
Pt reported to our RN that medication from recent ER visit did not get sent to pharmacy. Telephone encounter created to re-prescribe her robaxin rx.

## 2020-10-13 ENCOUNTER — Other Ambulatory Visit (HOSPITAL_BASED_OUTPATIENT_CLINIC_OR_DEPARTMENT_OTHER): Payer: Self-pay | Admitting: Physician Assistant

## 2020-10-13 DIAGNOSIS — Z1231 Encounter for screening mammogram for malignant neoplasm of breast: Secondary | ICD-10-CM

## 2020-10-29 ENCOUNTER — Other Ambulatory Visit: Payer: Self-pay

## 2020-10-29 ENCOUNTER — Encounter (HOSPITAL_BASED_OUTPATIENT_CLINIC_OR_DEPARTMENT_OTHER): Payer: Self-pay

## 2020-10-29 ENCOUNTER — Ambulatory Visit (HOSPITAL_BASED_OUTPATIENT_CLINIC_OR_DEPARTMENT_OTHER)
Admission: RE | Admit: 2020-10-29 | Discharge: 2020-10-29 | Disposition: A | Discharge status: Home / Self Care | Payer: BC Managed Care – PPO | Source: Ambulatory Visit | Attending: Physician Assistant | Admitting: Physician Assistant

## 2020-10-29 DIAGNOSIS — Z1231 Encounter for screening mammogram for malignant neoplasm of breast: Secondary | ICD-10-CM | POA: Diagnosis present

## 2023-01-23 IMAGING — MG MM DIGITAL SCREENING BILAT W/ TOMO AND CAD
8 series · 8 of 24 positions shown · non-contrast
Comparison: Previous exam(s).

CLINICAL DATA: Screening.

EXAM:
DIGITAL SCREENING BILATERAL MAMMOGRAM WITH TOMO AND CAD

[R CC synth-2D]
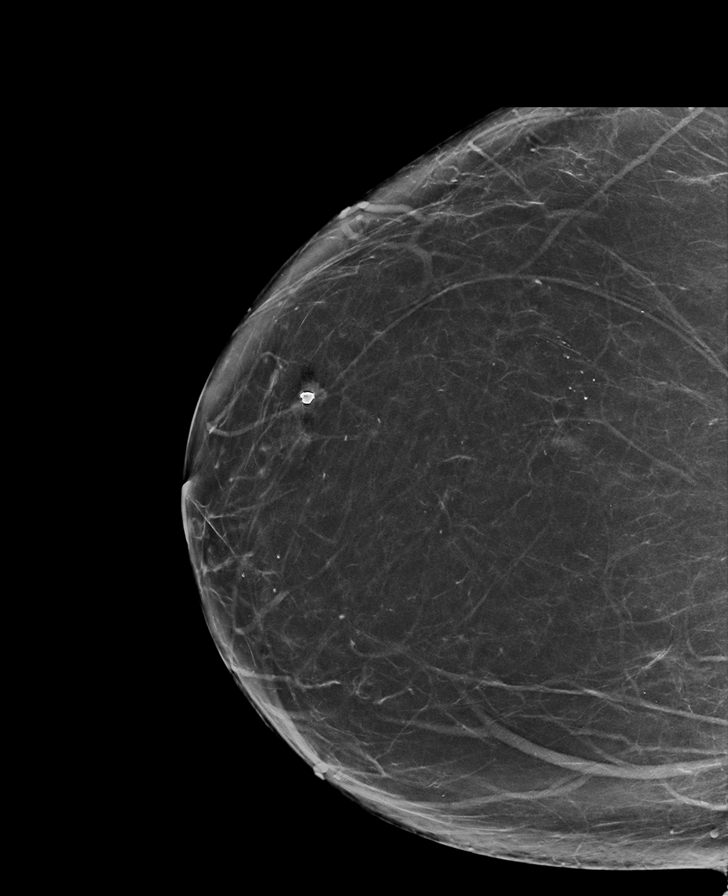

[R MLO synth-2D]
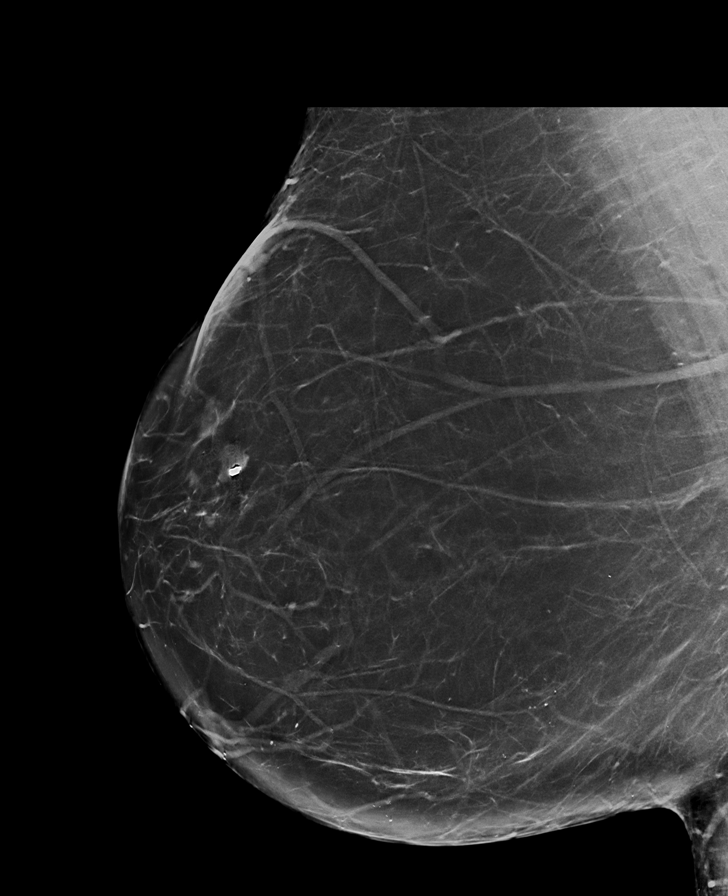

[L MLO synth-2D]
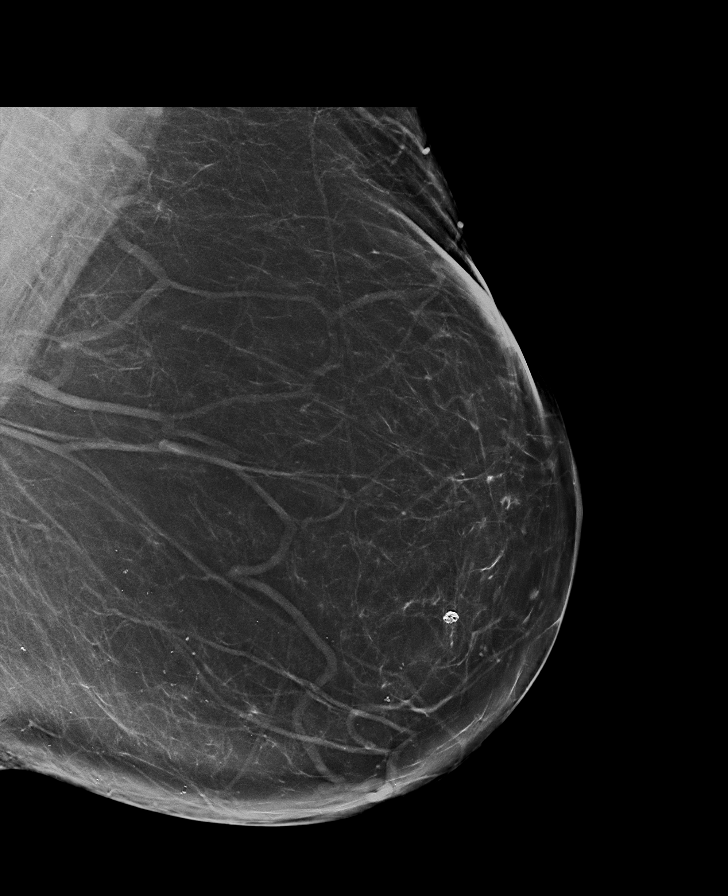

[L CC synth-2D]
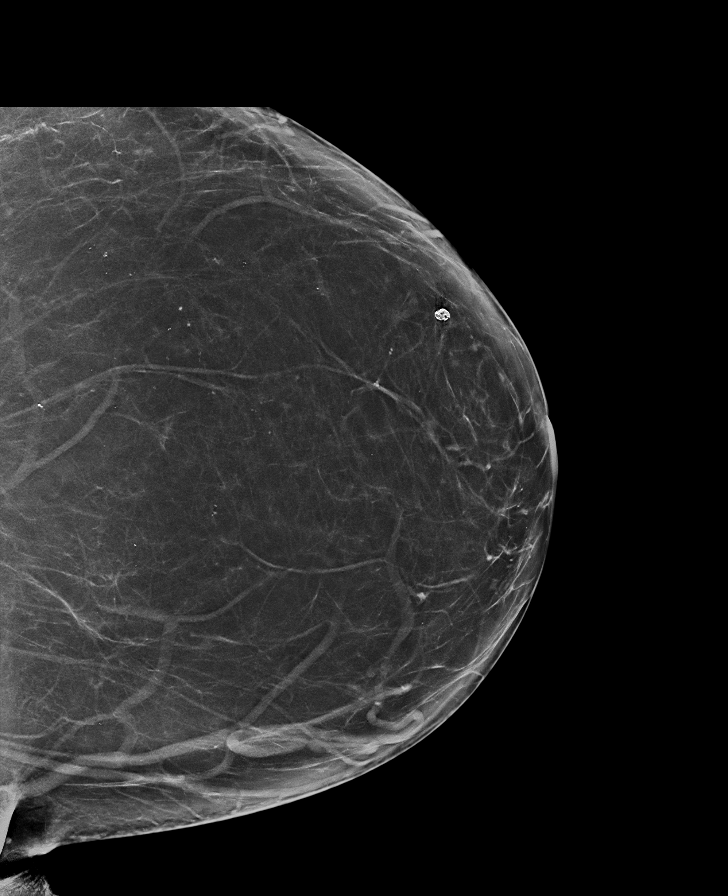

[L CC tomo · tomo slice 41/80.0]
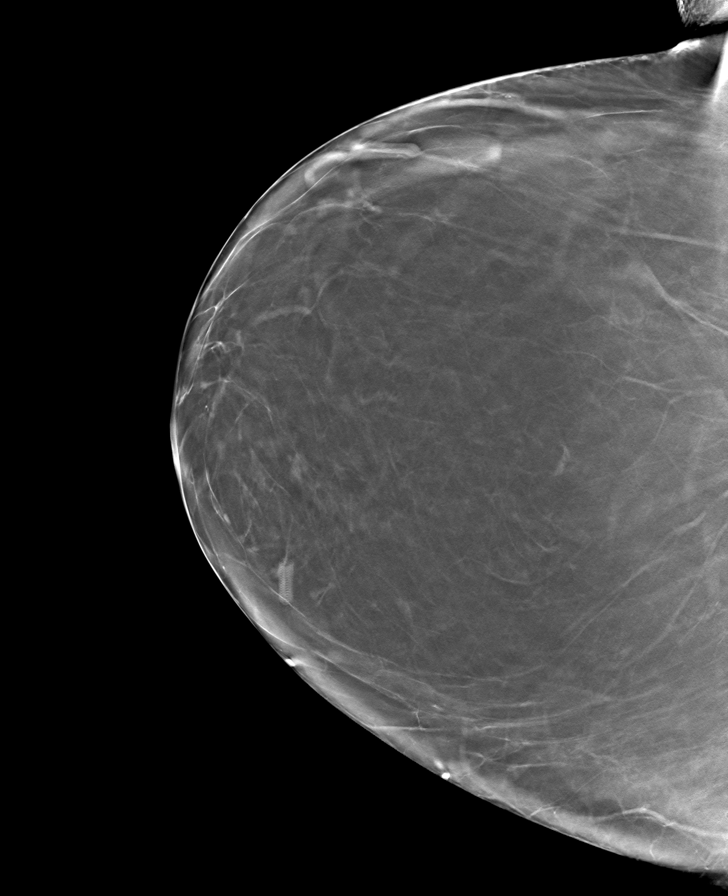

[R MLO tomo · tomo slice 43/84.0]
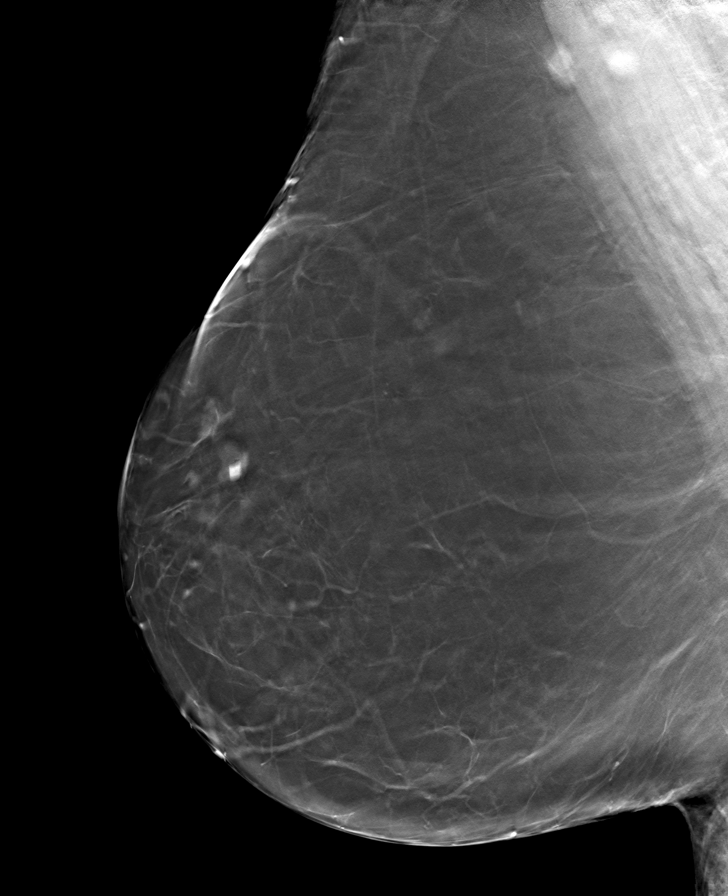

[L MLO tomo · tomo slice 42/83.0]
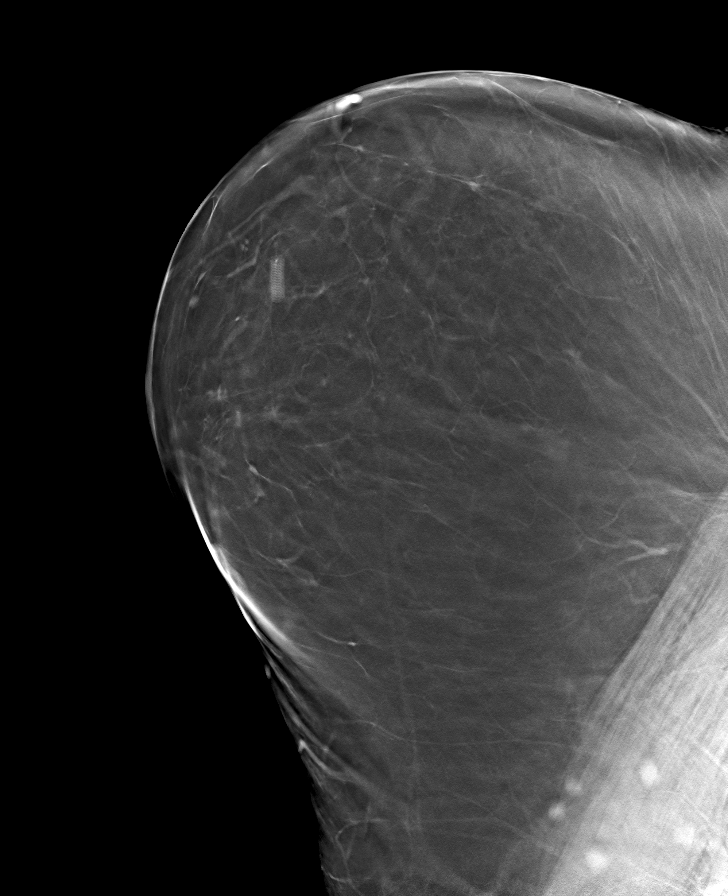

[R CC tomo · tomo slice 39/77.0]
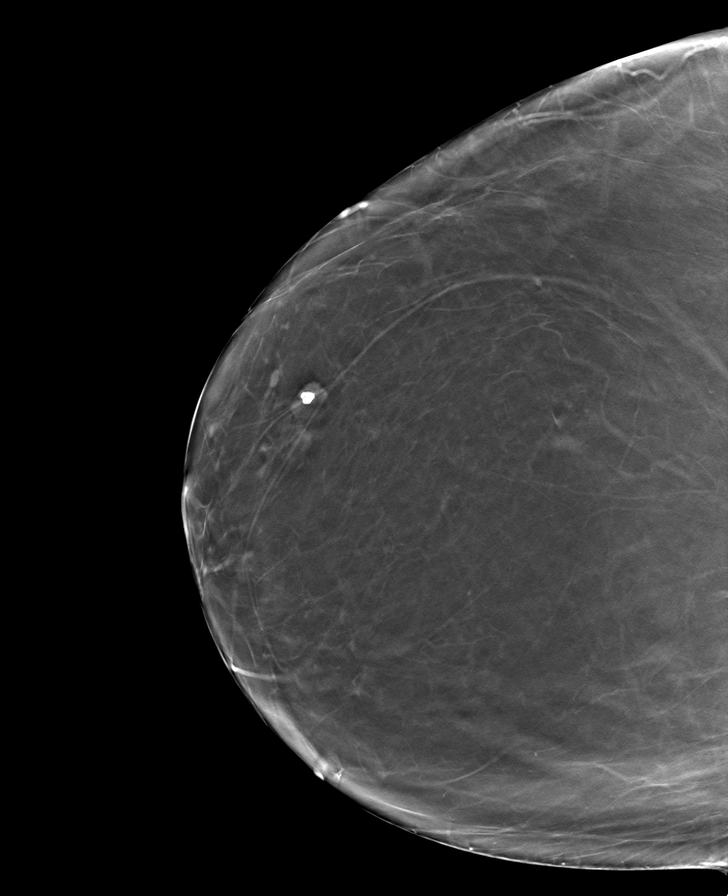

[8 of 24 positions shown; findings below may reference images not displayed]

ACR Breast Density Category b: There are scattered areas of
fibroglandular density.
FINDINGS: There are no findings suspicious for malignancy. The images were
evaluated with computer-aided detection.
IMPRESSION: No mammographic evidence of malignancy. A result letter of this
screening mammogram will be mailed directly to the patient.

RECOMMENDATION:
Screening mammogram in one year. (Code:ZP-7-VX7)

BI-RADS CATEGORY  1: Negative.
